# Patient Record
Sex: Female | Born: 1945 | Race: White | Hispanic: No | Smoking: Never smoker
Health system: Southern US, Community
[De-identification: ages and names within clinical notes are randomized; demographics above are authoritative.]

## PROBLEM LIST (undated history)

## (undated) DIAGNOSIS — M1990 Unspecified osteoarthritis, unspecified site: Secondary | ICD-10-CM

## (undated) DIAGNOSIS — I10 Essential (primary) hypertension: Secondary | ICD-10-CM

## (undated) HISTORY — PX: TUBAL LIGATION: SHX77

## (undated) HISTORY — PX: TONSILLECTOMY: SUR1361

---

## 2000-04-18 ENCOUNTER — Ambulatory Visit (HOSPITAL_COMMUNITY): Admission: RE | Admit: 2000-04-18 | Discharge: 2000-04-18 | Payer: Self-pay | Admitting: Gastroenterology

## 2000-04-18 ENCOUNTER — Encounter: Payer: Self-pay | Admitting: Gastroenterology

## 2000-04-21 ENCOUNTER — Ambulatory Visit (HOSPITAL_COMMUNITY): Admission: RE | Admit: 2000-04-21 | Discharge: 2000-04-21 | Payer: Self-pay | Admitting: Gastroenterology

## 2000-04-25 ENCOUNTER — Ambulatory Visit (HOSPITAL_COMMUNITY): Admission: RE | Admit: 2000-04-25 | Discharge: 2000-04-25 | Payer: Self-pay | Admitting: Gastroenterology

## 2000-04-25 ENCOUNTER — Encounter: Payer: Self-pay | Admitting: Gastroenterology

## 2000-04-28 ENCOUNTER — Ambulatory Visit (HOSPITAL_COMMUNITY): Admission: RE | Admit: 2000-04-28 | Discharge: 2000-04-28 | Payer: Self-pay | Admitting: Gastroenterology

## 2004-12-22 ENCOUNTER — Ambulatory Visit: Payer: Self-pay | Admitting: Urology

## 2006-11-13 IMAGING — CT CT ABDOMEN AND PELVIS WITHOUT AND WITH CONTRAST
2 of 7 series · 14 of 32 positions shown, 19 images · non-contrast
Comparison: none

REASON FOR EXAM: abdominal pain.blotting, change in bowel, renal colic,
bladder pressure
COMMENTS:

[Series 5: with inspace · axial · 0.68mm/px · z∈[-535,-157]mm · 8 of 488 slices shown, 13 images]
[im 55/488  soft-tissue]
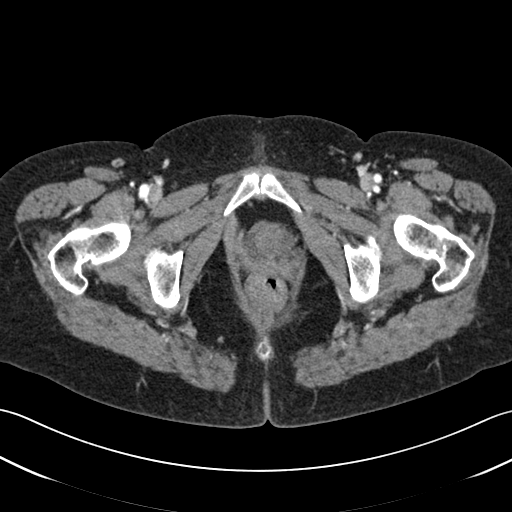
[im 55/488  bone]
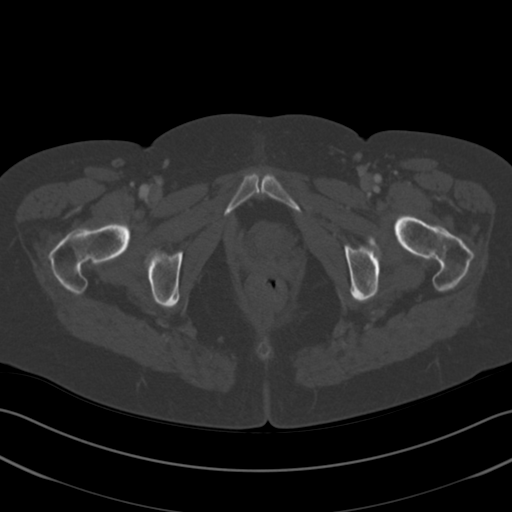
[im 109/488  soft-tissue]
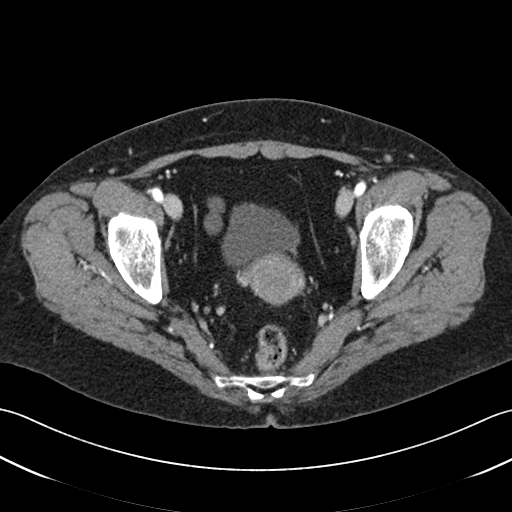
[im 163/488  soft-tissue]
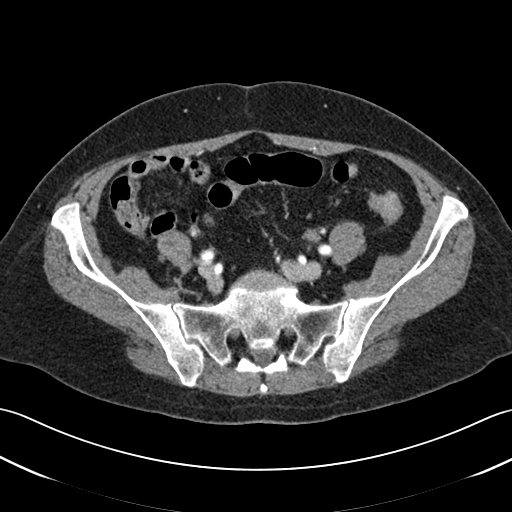
[im 217/488  soft-tissue]
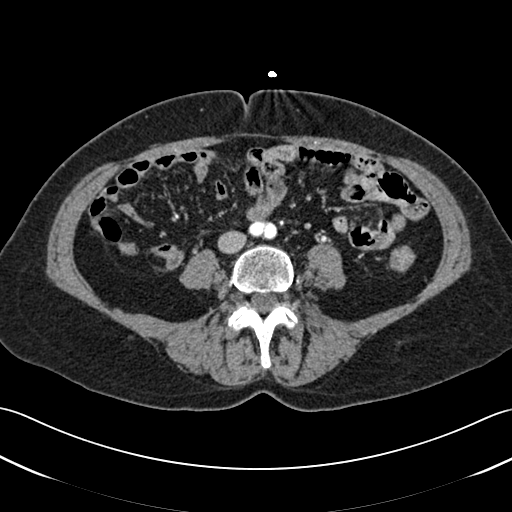
[im 271/488  soft-tissue]
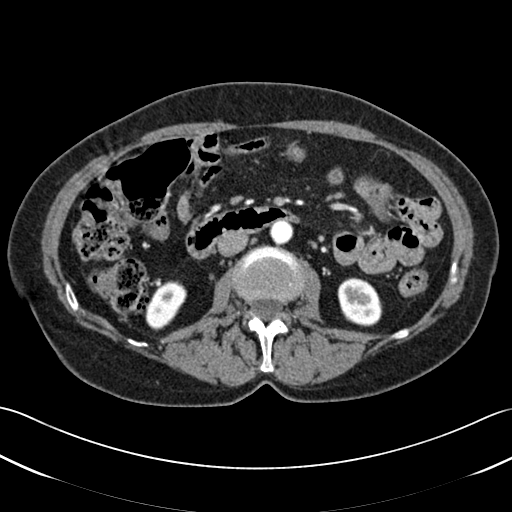
[im 271/488  lung]
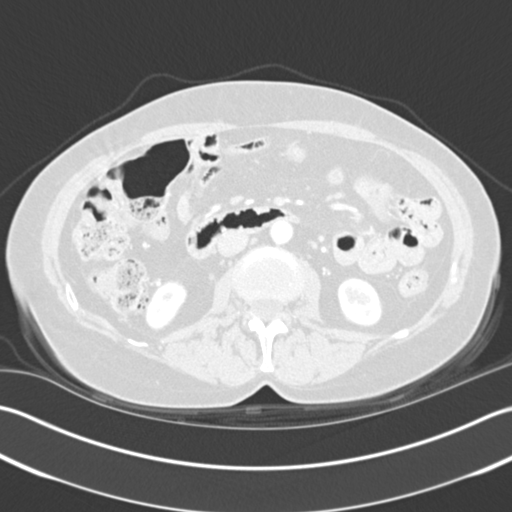
[im 325/488  soft-tissue]
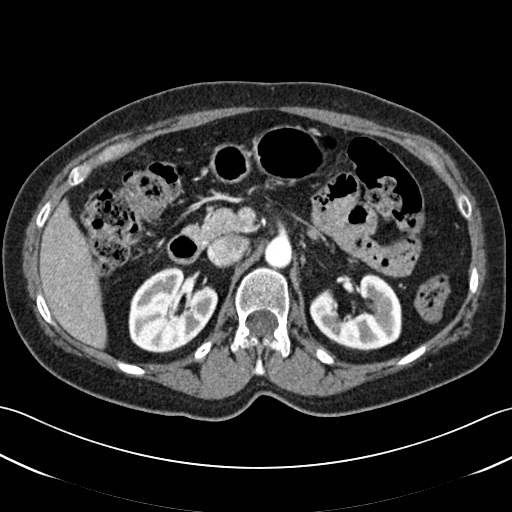
[im 325/488  lung]
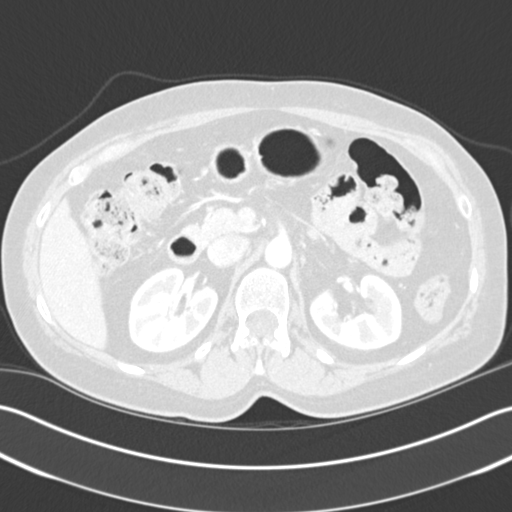
[im 379/488  soft-tissue]
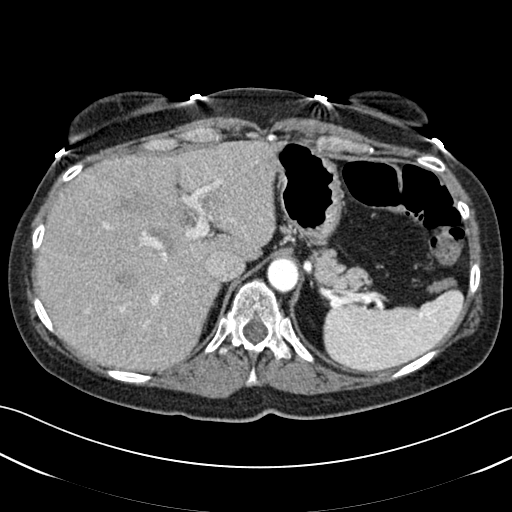
[im 379/488  lung]
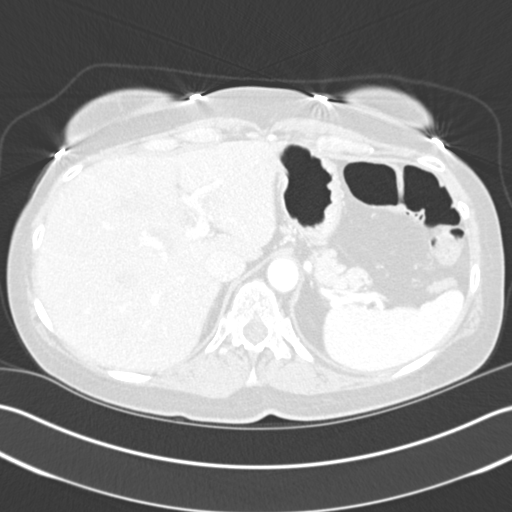
[im 433/488  soft-tissue]
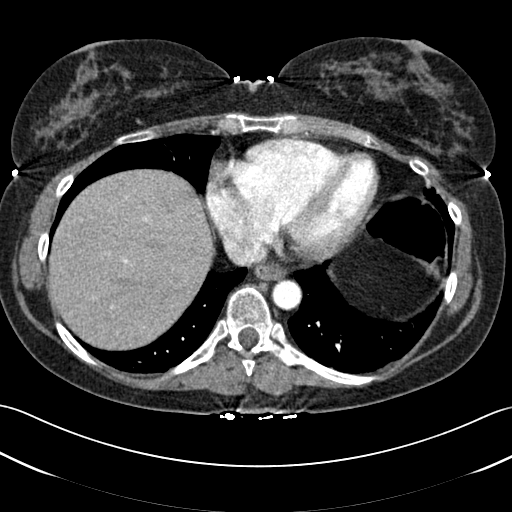
[im 433/488  lung]
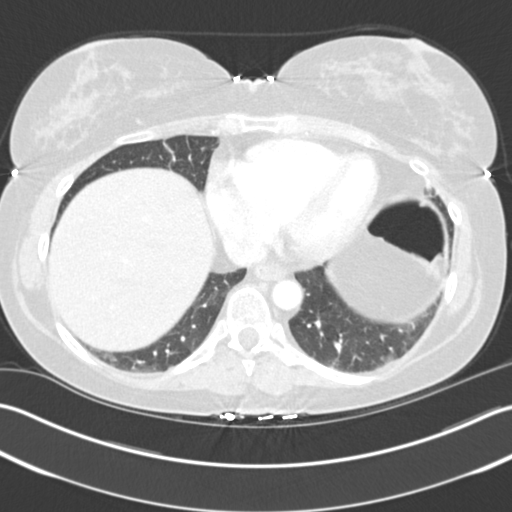

[Series 7: inspace · axial · 0.68mm/px · z∈[-535,-263]mm · 6 of 490 slices shown]
[im 55/490  soft-tissue]
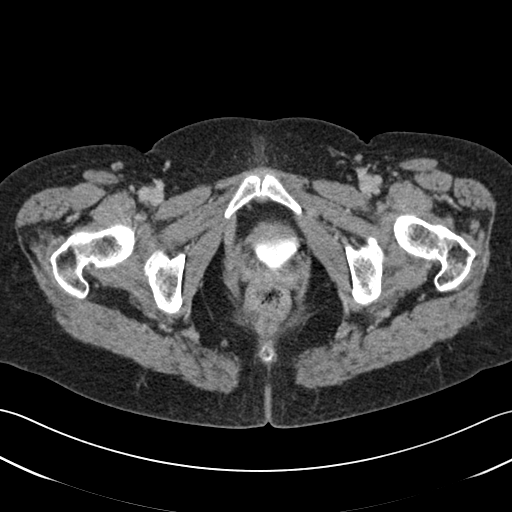
[im 109/490  soft-tissue]
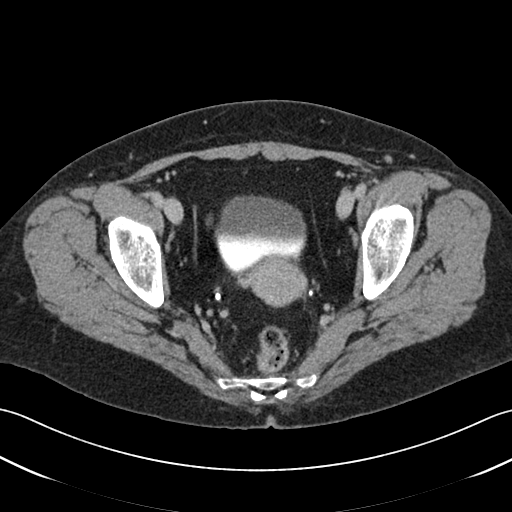
[im 164/490  soft-tissue]
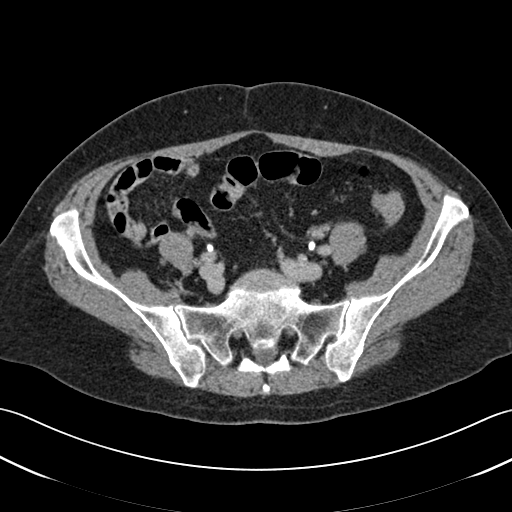
[im 218/490  soft-tissue]
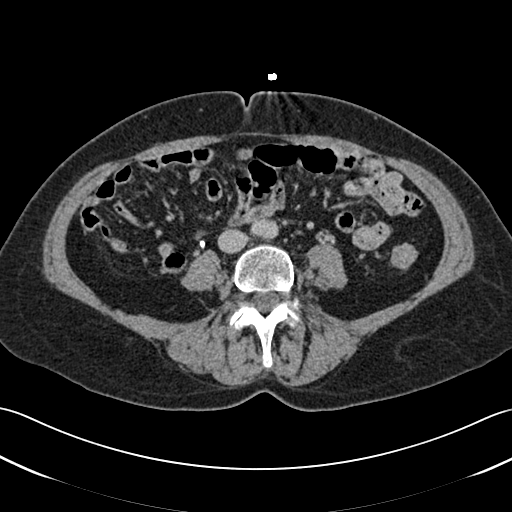
[im 272/490  soft-tissue]
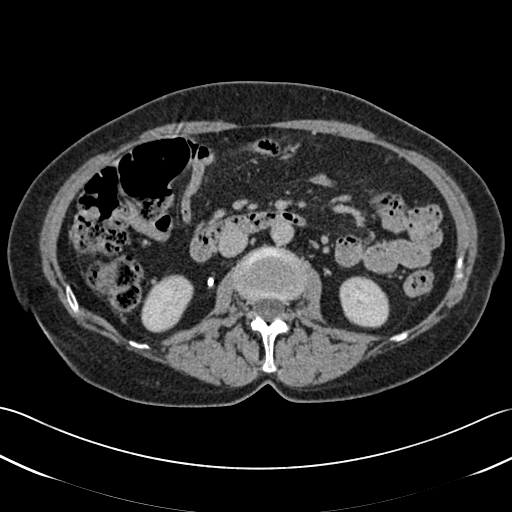
[im 327/490  soft-tissue]
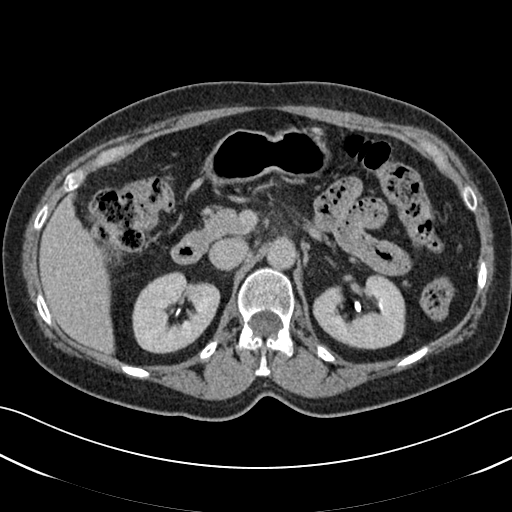

[14 of 32 positions shown; findings below may reference images not displayed]

PROCEDURE:     CT  - CT ABDOMEN / PELVIS  W/WO  - December 22, 2004  [DATE]

RESULT:         Standard triphasic CT of the abdomen and pelvis was
obtained.  The liver and gallbladder are normal.  There is no biliary
distention.  The pancreas is normal.  The adrenals are normal.  No focal
significant renal lesions are identified.  A tiny renal cyst including
parapelvic cysts are present.  There is no evidence of ureteral distention
or hydronephrosis.  Tiny calcified densities noted in the pelvis are most
consistent with phleboliths.  A very tiny sand-like stone in the LEFT distal
ureter cannot be completely excluded.  The abdominal aorta is normal.  No
evidence of retroperitoneal adenopathy.  The RIGHT lower quadrant is
unremarkable.  There is no bowel distention.  No pelvic masses are noted.
The bladder is unremarkable.  No inguinal adenopathy is noted.
IMPRESSION: Phleboliths in the pelvis.  A very tiny sand-like stone in the LEFT ureter
cannot be entirely excluded.

## 2012-04-26 ENCOUNTER — Ambulatory Visit: Payer: Self-pay

## 2012-05-10 ENCOUNTER — Ambulatory Visit: Payer: Self-pay | Admitting: Internal Medicine

## 2012-07-10 ENCOUNTER — Ambulatory Visit: Payer: Self-pay | Admitting: Gastroenterology

## 2012-07-11 LAB — PATHOLOGY REPORT

## 2019-08-15 ENCOUNTER — Other Ambulatory Visit: Payer: Self-pay | Admitting: Student

## 2019-08-15 DIAGNOSIS — K5900 Constipation, unspecified: Secondary | ICD-10-CM

## 2019-08-15 DIAGNOSIS — R1010 Upper abdominal pain, unspecified: Secondary | ICD-10-CM

## 2019-08-15 DIAGNOSIS — R140 Abdominal distension (gaseous): Secondary | ICD-10-CM

## 2019-08-24 ENCOUNTER — Other Ambulatory Visit: Payer: Self-pay

## 2019-08-24 ENCOUNTER — Ambulatory Visit
Admission: RE | Admit: 2019-08-24 | Discharge: 2019-08-24 | Disposition: A | Discharge status: Home / Self Care | Payer: Medicare Other | Source: Ambulatory Visit | Attending: Student | Admitting: Student

## 2019-08-24 DIAGNOSIS — R1010 Upper abdominal pain, unspecified: Secondary | ICD-10-CM | POA: Diagnosis present

## 2019-08-24 DIAGNOSIS — R140 Abdominal distension (gaseous): Secondary | ICD-10-CM | POA: Diagnosis present

## 2019-08-24 DIAGNOSIS — K5900 Constipation, unspecified: Secondary | ICD-10-CM | POA: Diagnosis present

## 2019-08-24 LAB — POCT I-STAT CREATININE: Creatinine, Ser: 0.8 mg/dL (ref 0.44–1.00)

## 2019-08-24 MED ORDER — IOHEXOL 300 MG/ML  SOLN
100.0000 mL | Freq: Once | INTRAMUSCULAR | Status: AC | PRN
  Administered 2019-08-24: 15:00:00 100 mL via INTRAVENOUS

## 2019-11-12 ENCOUNTER — Other Ambulatory Visit: Payer: Self-pay

## 2019-11-12 ENCOUNTER — Other Ambulatory Visit
Admission: RE | Admit: 2019-11-12 | Discharge: 2019-11-12 | Disposition: A | Discharge status: Home / Self Care | Payer: Medicare Other | Source: Ambulatory Visit | Attending: Internal Medicine | Admitting: Internal Medicine

## 2019-11-12 DIAGNOSIS — Z01812 Encounter for preprocedural laboratory examination: Secondary | ICD-10-CM | POA: Insufficient documentation

## 2019-11-12 DIAGNOSIS — Z20828 Contact with and (suspected) exposure to other viral communicable diseases: Secondary | ICD-10-CM | POA: Insufficient documentation

## 2019-11-13 LAB — SARS CORONAVIRUS 2 (TAT 6-24 HRS): SARS Coronavirus 2: NEGATIVE

## 2019-11-14 ENCOUNTER — Encounter: Payer: Self-pay | Admitting: *Deleted

## 2019-11-15 ENCOUNTER — Ambulatory Visit: Payer: Medicare Other | Admitting: Anesthesiology

## 2019-11-15 ENCOUNTER — Other Ambulatory Visit: Payer: Self-pay

## 2019-11-15 ENCOUNTER — Ambulatory Visit
Admission: RE | Admit: 2019-11-15 | Discharge: 2019-11-15 | Disposition: A | Discharge status: Home / Self Care | Payer: Medicare Other | Attending: Internal Medicine | Admitting: Internal Medicine

## 2019-11-15 ENCOUNTER — Encounter: Payer: Self-pay | Admitting: Internal Medicine

## 2019-11-15 ENCOUNTER — Encounter
Admission: RE | Disposition: A | Discharge status: Home / Self Care | Payer: Self-pay | Source: Home / Self Care | Attending: Internal Medicine

## 2019-11-15 DIAGNOSIS — Z79899 Other long term (current) drug therapy: Secondary | ICD-10-CM | POA: Diagnosis not present

## 2019-11-15 DIAGNOSIS — R1013 Epigastric pain: Secondary | ICD-10-CM | POA: Insufficient documentation

## 2019-11-15 DIAGNOSIS — K5730 Diverticulosis of large intestine without perforation or abscess without bleeding: Secondary | ICD-10-CM | POA: Diagnosis not present

## 2019-11-15 DIAGNOSIS — Z7951 Long term (current) use of inhaled steroids: Secondary | ICD-10-CM | POA: Insufficient documentation

## 2019-11-15 DIAGNOSIS — K641 Second degree hemorrhoids: Secondary | ICD-10-CM | POA: Insufficient documentation

## 2019-11-15 DIAGNOSIS — R933 Abnormal findings on diagnostic imaging of other parts of digestive tract: Secondary | ICD-10-CM | POA: Insufficient documentation

## 2019-11-15 DIAGNOSIS — K5909 Other constipation: Secondary | ICD-10-CM | POA: Diagnosis not present

## 2019-11-15 HISTORY — PX: COLONOSCOPY WITH PROPOFOL: SHX5780

## 2019-11-15 HISTORY — PX: ESOPHAGOGASTRODUODENOSCOPY (EGD) WITH PROPOFOL: SHX5813

## 2019-11-15 SURGERY — ESOPHAGOGASTRODUODENOSCOPY (EGD) WITH PROPOFOL
Anesthesia: General

## 2019-11-15 MED ORDER — SODIUM CHLORIDE 0.9 % IV SOLN
INTRAVENOUS | Status: DC
  Administered 2019-11-15: 1000 mL via INTRAVENOUS

## 2019-11-15 MED ORDER — PROPOFOL 500 MG/50ML IV EMUL
INTRAVENOUS | Status: DC | PRN
  Administered 2019-11-15: 175 ug/kg/min via INTRAVENOUS

## 2019-11-15 MED ORDER — LIDOCAINE HCL (CARDIAC) PF 100 MG/5ML IV SOSY: PREFILLED_SYRINGE | INTRAVENOUS | Status: DC | PRN

## 2019-11-15 MED ORDER — PROPOFOL 10 MG/ML IV BOLUS
INTRAVENOUS | Status: AC
  Filled 2019-11-15: qty 20

## 2019-11-15 MED ORDER — SODIUM CHLORIDE 0.9 % IV SOLN: INTRAVENOUS | Status: DC | PRN

## 2019-11-15 MED ORDER — LIDOCAINE HCL (CARDIAC) PF 100 MG/5ML IV SOSY
PREFILLED_SYRINGE | INTRAVENOUS | Status: DC | PRN
  Administered 2019-11-15: 40 mg via INTRAVENOUS

## 2019-11-15 MED ORDER — PROPOFOL 10 MG/ML IV BOLUS
INTRAVENOUS | Status: DC | PRN
  Administered 2019-11-15: 50 mg via INTRAVENOUS
  Administered 2019-11-15: 20 mg via INTRAVENOUS

## 2019-11-15 NOTE — Op Note (Signed)
[facility] Gastroenterology Patient Name: Carla SpineBetty Candella Procedure Date: 11/15/2019 9:36 AM MRN: [id number] Account #: [id number] Date of Birth: 1946-07-12 Admit Type: Outpatient Age: 73 [facility] 1 Gender: Female Note Status: Finalized Procedure:             Colonoscopy Indications:           Epigastric abdominal pain, Evaluation of abnormal                         imaging study (likely to be clinically significant),                         Change in bowel habits Providers:             Boykin Nearingeodoro K. Norma Fredricksonoledo MD, MD Referring MD:          No Local Md, MD (Referring MD) Medicines:             Propofol per Anesthesia Complications:         No immediate complications. Estimated blood loss: None. Procedure:             Pre-Anesthesia Assessment:                        - The risks and benefits of the procedure and the                         sedation options and risks were discussed with the                         patient. All questions were answered and informed                         consent was obtained.                        - Patient identification and proposed procedure were                         verified prior to the procedure by the nurse. The                         procedure was verified in the procedure room.                        - ASA Grade Assessment: III - A patient with severe                         systemic disease.                        - After reviewing the risks and benefits, the patient                         was deemed in satisfactory condition to undergo the                         procedure.                        After obtaining  informed consent, the colonoscope was                         passed under direct vision. Throughout the procedure,                         the patient's blood pressure, pulse, and oxygen                         saturations were monitored continuously. The                         Colonoscope was  introduced through the anus and                         advanced to the the cecum, identified by appendiceal                         orifice and ileocecal valve. The colonoscopy was                         performed without difficulty. The patient tolerated                         the procedure well. The quality of the bowel                         preparation was excellent. The ileocecal valve,                         appendiceal orifice, and rectum were photographed. Findings:      The perianal exam findings include internal hemorrhoids that prolapse       with straining, but spontaneously regress to the resting position (Grade       II).      Non-bleeding internal hemorrhoids were found during retroflexion. The       hemorrhoids were Grade I (internal hemorrhoids that do not prolapse).      A few small-mouthed diverticula were found in the sigmoid colon.      The exam was otherwise without abnormality on direct and retroflexion       views. Impression:            - Internal hemorrhoids that prolapse with straining,                         but spontaneously regress to the resting position                         (Grade II) found on perianal exam.                        - Non-bleeding internal hemorrhoids.                        - Diverticulosis in the sigmoid colon.                        - The examination was otherwise normal on direct and  retroflexion views.                        - No specimens collected. Recommendation:        - Patient has a contact number available for                         emergencies. The signs and symptoms of potential                         delayed complications were discussed with the patient.                         Return to normal activities tomorrow. Written                         discharge instructions were provided to the patient.                        - Resume previous diet.                        - Continue present  medications.                        - NO repeat colonoscopy due to age/comorbid status                        - Return to physician assistant in 3 months.                        - The findings and recommendations were discussed with                         the patient. Procedure Code(s):     --- Professional ---                        [phone], Colonoscopy, flexible; diagnostic, including                         collection of specimen(s) by brushing or washing, when                         performed (separate procedure) Diagnosis Code(s):     --- Professional ---                        R93.3, Abnormal findings on diagnostic imaging of                         other parts of digestive tract                        K57.30, Diverticulosis of large intestine without                         perforation or abscess without bleeding                        R19.4, Change in bowel habit  R10.13, Epigastric pain                        K64.1, Second degree hemorrhoids CPT copyright 2019 American Medical Association. All rights reserved. The codes documented in this report are preliminary and upon coder review may  be revised to meet current compliance requirements. Efrain Sella MD, MD 11/15/2019 10:09:02 AM This report has been signed electronically. Number of Addenda: 0 Note Initiated On: 11/15/2019 9:36 AM Scope Withdrawal Time: 0 hours 6 minutes 10 seconds  Total Procedure Duration: 0 hours 10 minutes 11 seconds  Estimated Blood Loss:  Estimated blood loss: none.      [facility]

## 2019-11-15 NOTE — Transfer of Care (Signed)
Immediate Anesthesia Transfer of Care Note  Patient: Carla David  Procedure(s) Performed: ESOPHAGOGASTRODUODENOSCOPY (EGD) WITH PROPOFOL (N/A ) COLONOSCOPY WITH PROPOFOL (N/A )  Patient Location: PACU and Endoscopy Unit  Anesthesia Type:General  Level of Consciousness: drowsy and responds to stimulation  Airway & Oxygen Therapy: Patient Spontanous Breathing and Patient connected to nasal cannula oxygen  Post-op Assessment: Report given to RN and Post -op Vital signs reviewed and stable  Post vital signs: Reviewed and stable  Last Vitals:  Vitals Value Taken Time  BP    Temp    Pulse 88 11/15/19 1008  Resp 19 11/15/19 1008  SpO2 97 % 11/15/19 1008  Vitals shown include unvalidated device data.  Last Pain:  Vitals:   11/15/19 0902  TempSrc: Temporal  PainSc: 0-No pain         Complications: No apparent anesthesia complications

## 2019-11-15 NOTE — Anesthesia Postprocedure Evaluation (Signed)
Anesthesia Post Note  Patient: Carla David  Procedure(s) Performed: ESOPHAGOGASTRODUODENOSCOPY (EGD) WITH PROPOFOL (N/A ) COLONOSCOPY WITH PROPOFOL (N/A )  Patient location during evaluation: Endoscopy Anesthesia Type: General Level of consciousness: awake and alert Pain management: pain level controlled Vital Signs Assessment: post-procedure vital signs reviewed and stable Respiratory status: spontaneous breathing, nonlabored ventilation, respiratory function stable and patient connected to nasal cannula oxygen Cardiovascular status: blood pressure returned to baseline and stable Postop Assessment: no apparent nausea or vomiting Anesthetic complications: no     Last Vitals:  Vitals:   11/15/19 1020 11/15/19 1030  BP: 107/62 126/75  Pulse: 85 80  Resp: 20 18  Temp:    SpO2: 100% 100%    Last Pain:  Vitals:   11/15/19 1030  TempSrc:   PainSc: 0-No pain                 Ayse Mccartin S

## 2019-11-15 NOTE — Anesthesia Post-op Follow-up Note (Signed)
Anesthesia QCDR form completed.        

## 2019-11-15 NOTE — Op Note (Signed)
[facility] Gastroenterology Patient Name: Carla David Procedure Date: 11/15/2019 9:36 AM MRN: [id number] Account #: [id number] Date of Birth: 13-Oct-1946 Admit Type: Outpatient Age: 73 Room: [facility] ENDO ROOM 1 Gender: Female Note Status: Finalized Procedure:             Upper GI endoscopy Indications:           Epigastric abdominal pain Providers:             Benay Pike. [name] MD, MD Referring MD:          No Local Md, MD (Referring MD) Medicines:             Propofol per Anesthesia Complications:         No immediate complications. Procedure:             Pre-Anesthesia Assessment:                        - The risks and benefits of the procedure and the                         sedation options and risks were discussed with the                         patient. All questions were answered and informed                         consent was obtained.                        - Patient identification and proposed procedure were                         verified prior to the procedure by the nurse. The                         procedure was verified in the procedure room.                        - ASA Grade Assessment: III - A patient with severe                         systemic disease.                        - After reviewing the risks and benefits, the patient                         was deemed in satisfactory condition to undergo the                         procedure.                        After obtaining informed consent, the endoscope was                         passed under direct vision. Throughout the procedure,                         the patient's blood pressure,  pulse, and oxygen                         saturations were monitored continuously. The Endoscope                         was introduced through the mouth, and advanced to the                         third part of duodenum. The upper GI endoscopy was                         accomplished without difficulty.  The patient tolerated                         the procedure well. Findings:      The esophagus was normal.      The stomach was normal.      The examined duodenum was normal. Impression:            - Normal esophagus.                        - Normal stomach.                        - Normal examined duodenum.                        - No specimens collected. Recommendation:        - Proceed with colonoscopy Procedure Code(s):     --- Professional ---                        [phone], Esophagogastroduodenoscopy, flexible,                         transoral; diagnostic, including collection of                         specimen(s) by brushing or washing, when performed                         (separate procedure) Diagnosis Code(s):     --- Professional ---                        R10.13, Epigastric pain CPT copyright 2019 American Medical Association. All rights reserved. The codes documented in this report are preliminary and upon coder review may  be revised to meet current compliance requirements. Stanton Kidney MD, MD 11/15/2019 9:52:55 AM This report has been signed electronically. Number of Addenda: 0 Note Initiated On: 11/15/2019 9:36 AM Estimated Blood Loss:  Estimated blood loss: none.      [facility]

## 2019-11-15 NOTE — H&P (Signed)
Outpatient short stay form Pre-procedure 11/15/2019 9:30 AM Carla David K. [name], M.D.  Primary Physician: Alwyn Pea, NP  Reason for visit: Epigastric pain, bloating, change in bowel habits. Abnormal CT of the colon.  History of present illness:  As above. Otherwise, Patient denies  rectal bleeding, weight loss or abdominal pain. Has moderate chronic constipation. CT showed mild eccentric wall thickening around the posterior cecum.     Current Facility-Administered Medications:  .  0.9 %  sodium chloride infusion, , Intravenous, Continuous, Sherald Balbuena, Benay Pike, MD  Medications Prior to Admission  Medication Sig Dispense Refill Last Dose  . albuterol (ACCUNEB) 0.63 MG/3ML nebulizer solution Take 1 ampule by nebulization every 6 (six) hours as needed for wheezing.    at prn  . fluticasone (FLONASE) 50 MCG/ACT nasal spray Place into both nostrils daily.    at prn  . ibuprofen (ADVIL) 200 MG tablet Take 200 mg by mouth every 6 (six) hours as needed.   Past Month at Unknown time  . cefdinir (OMNICEF) 300 MG capsule Take 300 mg by mouth 2 (two) times daily.   Completed Course at Unknown time  . Chlorphen-Pseudoephed-APAP (CORICIDIN D PO) Take by mouth.   Completed Course at Unknown time  . ondansetron (ZOFRAN) 4 MG tablet Take 4 mg by mouth every 8 (eight) hours as needed for nausea or vomiting.   Not Taking at Unknown time  . phenazopyridine (PYRIDIUM) 100 MG tablet Take 100 mg by mouth 3 (three) times daily as needed for pain.   Not Taking at Unknown time     No Known Allergies   History reviewed. No pertinent past medical history.  Review of systems:  Otherwise negative.    Physical Exam  Gen: Alert, oriented. Appears stated age.  HEENT: [place]/AT. PERRLA. Lungs: CTA, no wheezes. CV: RR nl S1, S2. Abd: soft, benign, no masses. BS+ Ext: No edema. Pulses 2+    Planned procedures: Proceed with EGD and colonoscopy. The patient understands the nature of the planned procedure,  indications, risks, alternatives and potential complications including but not limited to bleeding, infection, perforation, damage to internal organs and possible oversedation/side effects from anesthesia. The patient agrees and gives consent to proceed.  Please refer to procedure notes for findings, recommendations and patient disposition/instructions.     Jolean Madariaga K. [name], M.D. Gastroenterology 11/15/2019  9:30 AM

## 2019-11-15 NOTE — Interval H&P Note (Signed)
History and Physical Interval Note:  11/15/2019 9:35 AM  Carla David  has presented today for surgery, with the diagnosis of constipation bloat upper abd.  The various methods of treatment have been discussed with the patient and family. After consideration of risks, benefits and other options for treatment, the patient has consented to  Procedure(s): ESOPHAGOGASTRODUODENOSCOPY (EGD) WITH PROPOFOL (N/A) COLONOSCOPY WITH PROPOFOL (N/A) as a surgical intervention.  The patient's history has been reviewed, patient examined, no change in status, stable for surgery.  I have reviewed the patient's chart and labs.  Questions were answered to the patient's satisfaction.     [place], [place]

## 2019-11-15 NOTE — Anesthesia Preprocedure Evaluation (Signed)
Anesthesia Evaluation  Patient identified by MRN, date of birth, ID band Patient awake    Reviewed: Allergy & Precautions, NPO status , Patient's Chart, lab work & pertinent test results, reviewed documented beta blocker date and time   Airway Mallampati: II  TM Distance: >3 FB     Dental  (+) Chipped   Pulmonary           Cardiovascular      Neuro/Psych    GI/Hepatic   Endo/Other    Renal/GU      Musculoskeletal   Abdominal   Peds  Hematology   Anesthesia Other Findings   Reproductive/Obstetrics                             Anesthesia Physical Anesthesia Plan  ASA: II  Anesthesia Plan: General   Post-op Pain Management:    Induction: Intravenous  PONV Risk Score and Plan:   Airway Management Planned:   Additional Equipment:   Intra-op Plan:   Post-operative Plan:   Informed Consent: I have reviewed the patients History and Physical, chart, labs and discussed the procedure including the risks, benefits and alternatives for the proposed anesthesia with the patient or authorized representative who has indicated his/her understanding and acceptance.     Plan Discussed with: CRNA  Anesthesia Plan Comments:         Anesthesia Quick Evaluation  

## 2019-11-16 ENCOUNTER — Encounter: Payer: Self-pay | Admitting: *Deleted

## 2020-01-10 ENCOUNTER — Other Ambulatory Visit: Payer: Self-pay | Admitting: Physician Assistant

## 2020-01-10 DIAGNOSIS — Z13820 Encounter for screening for osteoporosis: Secondary | ICD-10-CM

## 2020-07-03 ENCOUNTER — Other Ambulatory Visit: Payer: Self-pay

## 2020-07-03 ENCOUNTER — Encounter: Payer: Self-pay | Admitting: Ophthalmology

## 2020-07-07 ENCOUNTER — Other Ambulatory Visit
Admission: RE | Admit: 2020-07-07 | Discharge: 2020-07-07 | Disposition: A | Discharge status: Home / Self Care | Payer: Medicare Other | Source: Ambulatory Visit | Attending: Ophthalmology | Admitting: Ophthalmology

## 2020-07-07 ENCOUNTER — Other Ambulatory Visit: Payer: Self-pay

## 2020-07-07 DIAGNOSIS — Z01812 Encounter for preprocedural laboratory examination: Secondary | ICD-10-CM | POA: Diagnosis present

## 2020-07-07 DIAGNOSIS — Z20822 Contact with and (suspected) exposure to covid-19: Secondary | ICD-10-CM | POA: Diagnosis not present

## 2020-07-07 LAB — SARS CORONAVIRUS 2 (TAT 6-24 HRS): SARS Coronavirus 2: NEGATIVE

## 2020-07-08 NOTE — Discharge Instructions (Signed)

## 2020-07-09 ENCOUNTER — Ambulatory Visit: Payer: Medicare Other | Admitting: Anesthesiology

## 2020-07-09 ENCOUNTER — Ambulatory Visit
Admission: RE | Admit: 2020-07-09 | Discharge: 2020-07-09 | Disposition: A | Discharge status: Home / Self Care | Payer: Medicare Other | Attending: Ophthalmology | Admitting: Ophthalmology

## 2020-07-09 ENCOUNTER — Encounter: Payer: Self-pay | Admitting: Ophthalmology

## 2020-07-09 ENCOUNTER — Encounter
Admission: RE | Disposition: A | Discharge status: Home / Self Care | Payer: Self-pay | Source: Home / Self Care | Attending: Ophthalmology

## 2020-07-09 ENCOUNTER — Other Ambulatory Visit: Payer: Self-pay

## 2020-07-09 DIAGNOSIS — I10 Essential (primary) hypertension: Secondary | ICD-10-CM | POA: Insufficient documentation

## 2020-07-09 DIAGNOSIS — M1990 Unspecified osteoarthritis, unspecified site: Secondary | ICD-10-CM | POA: Insufficient documentation

## 2020-07-09 DIAGNOSIS — Z79890 Hormone replacement therapy (postmenopausal): Secondary | ICD-10-CM | POA: Insufficient documentation

## 2020-07-09 DIAGNOSIS — H2511 Age-related nuclear cataract, right eye: Secondary | ICD-10-CM | POA: Insufficient documentation

## 2020-07-09 DIAGNOSIS — Z79899 Other long term (current) drug therapy: Secondary | ICD-10-CM | POA: Insufficient documentation

## 2020-07-09 HISTORY — DX: Unspecified osteoarthritis, unspecified site: M19.90

## 2020-07-09 HISTORY — DX: Essential (primary) hypertension: I10

## 2020-07-09 HISTORY — PX: CATARACT EXTRACTION W/PHACO: SHX586

## 2020-07-09 SURGERY — PHACOEMULSIFICATION, CATARACT, WITH IOL INSERTION
Anesthesia: Monitor Anesthesia Care | Site: Eye | Laterality: Right

## 2020-07-09 MED ORDER — FENTANYL CITRATE (PF) 100 MCG/2ML IJ SOLN
INTRAMUSCULAR | Status: DC | PRN
Start: 2020-07-09 — End: 2020-07-09
  Administered 2020-07-09: 25 ug via INTRAVENOUS
  Administered 2020-07-09: 50 ug via INTRAVENOUS

## 2020-07-09 MED ORDER — ACETAMINOPHEN 10 MG/ML IV SOLN: 1000.0000 mg | Freq: Once | INTRAVENOUS | Status: DC | PRN

## 2020-07-09 MED ORDER — LIDOCAINE HCL (PF) 2 % IJ SOLN
INTRAOCULAR | Status: DC | PRN
  Administered 2020-07-09: 2 mL

## 2020-07-09 MED ORDER — EPINEPHRINE PF 1 MG/ML IJ SOLN
INTRAOCULAR | Status: DC | PRN
  Administered 2020-07-09: 83 mL via OPHTHALMIC

## 2020-07-09 MED ORDER — NA HYALUR & NA CHOND-NA HYALUR 0.4-0.35 ML IO KIT
PACK | INTRAOCULAR | Status: DC | PRN
  Administered 2020-07-09: 1 mL via INTRAOCULAR

## 2020-07-09 MED ORDER — MIDAZOLAM HCL 2 MG/2ML IJ SOLN
INTRAMUSCULAR | Status: DC | PRN
  Administered 2020-07-09: .5 mg via INTRAVENOUS
  Administered 2020-07-09: 1 mg via INTRAVENOUS

## 2020-07-09 MED ORDER — LACTATED RINGERS IV SOLN: INTRAVENOUS | Status: DC

## 2020-07-09 MED ORDER — MOXIFLOXACIN HCL 0.5 % OP SOLN
1.0000 [drp] | OPHTHALMIC | Status: DC | PRN
  Administered 2020-07-09 (×3): 1 [drp] via OPHTHALMIC

## 2020-07-09 MED ORDER — ONDANSETRON HCL 4 MG/2ML IJ SOLN: 4.0000 mg | Freq: Once | INTRAMUSCULAR | Status: DC | PRN

## 2020-07-09 MED ORDER — BRIMONIDINE TARTRATE-TIMOLOL 0.2-0.5 % OP SOLN
OPHTHALMIC | Status: DC | PRN
  Administered 2020-07-09: 1 [drp] via OPHTHALMIC

## 2020-07-09 MED ORDER — TETRACAINE HCL 0.5 % OP SOLN
1.0000 [drp] | OPHTHALMIC | Status: DC | PRN
  Administered 2020-07-09 (×3): 1 [drp] via OPHTHALMIC

## 2020-07-09 MED ORDER — CEFUROXIME OPHTHALMIC INJECTION 1 MG/0.1 ML
INJECTION | OPHTHALMIC | Status: DC | PRN
  Administered 2020-07-09: 0.1 mL via INTRACAMERAL

## 2020-07-09 MED ORDER — ARMC OPHTHALMIC DILATING DROPS
1.0000 "application " | OPHTHALMIC | Status: DC | PRN
  Administered 2020-07-09 (×3): 1 via OPHTHALMIC

## 2020-07-09 SURGICAL SUPPLY — 23 items
CANNULA ANT/CHMB 27G (MISCELLANEOUS) ×1 IMPLANT
CANNULA ANT/CHMB 27GA (MISCELLANEOUS) ×3 IMPLANT
GLOVE SURG LX 7.5 STRW (GLOVE) ×2
GLOVE SURG LX STRL 7.5 STRW (GLOVE) ×1 IMPLANT
GLOVE SURG TRIUMPH 8.0 PF LTX (GLOVE) ×3 IMPLANT
GOWN STRL REUS W/ TWL LRG LVL3 (GOWN DISPOSABLE) ×2 IMPLANT
GOWN STRL REUS W/TWL LRG LVL3 (GOWN DISPOSABLE) ×6
LENS IOL DIOP 21.5 (Intraocular Lens) ×3 IMPLANT
LENS IOL TECNIS MONO 21.5 (Intraocular Lens) IMPLANT
MARKER SKIN DUAL TIP RULER LAB (MISCELLANEOUS) ×3 IMPLANT
NDL CAPSULORHEX 25GA (NEEDLE) ×1 IMPLANT
NDL FILTER BLUNT 18X1 1/2 (NEEDLE) ×2 IMPLANT
NEEDLE CAPSULORHEX 25GA (NEEDLE) ×3 IMPLANT
NEEDLE FILTER BLUNT 18X 1/2SAF (NEEDLE) ×4
NEEDLE FILTER BLUNT 18X1 1/2 (NEEDLE) ×2 IMPLANT
PACK CATARACT BRASINGTON (MISCELLANEOUS) ×3 IMPLANT
PACK EYE AFTER SURG (MISCELLANEOUS) ×3 IMPLANT
PACK OPTHALMIC (MISCELLANEOUS) ×3 IMPLANT
SOLUTION OPHTHALMIC SALT (MISCELLANEOUS) ×3 IMPLANT
SYR 3ML LL SCALE MARK (SYRINGE) ×6 IMPLANT
SYR TB 1ML LUER SLIP (SYRINGE) ×3 IMPLANT
WATER STERILE IRR 250ML POUR (IV SOLUTION) ×3 IMPLANT
WIPE NON LINTING 3.25X3.25 (MISCELLANEOUS) ×3 IMPLANT

## 2020-07-09 NOTE — H&P (Signed)

## 2020-07-09 NOTE — Op Note (Signed)
LOCATION:  [facility]   PREOPERATIVE DIAGNOSIS:    Nuclear sclerotic cataract right eye. H25.11   POSTOPERATIVE DIAGNOSIS:  Nuclear sclerotic cataract right eye.     PROCEDURE:  Phacoemusification with posterior chamber intraocular lens placement of the right eye   ULTRASOUND TIME: Procedure(s): CATARACT EXTRACTION PHACO AND INTRAOCULAR LENS PLACEMENT (IOC) RIGHT 7.34 01:03.9 11.5% (Right)  LENS:   Implant Name Type Inv. Item Serial No. Manufacturer Lot No. LRB No. Used Action  LENS IOL DIOP 21.5 - [id number] Intraocular Lens LENS IOL DIOP 21.5 [id number] [employer]  Right 1 Implanted         SURGEON:  Deirdre Evener, MD   ANESTHESIA:  Topical with tetracaine drops and 2% Xylocaine jelly, augmented with 1% preservative-free intracameral lidocaine.    COMPLICATIONS:  None.   DESCRIPTION OF PROCEDURE:  The patient was identified in the holding room and transported to the operating room and placed in the supine position under the operating microscope.  The right eye was identified as the operative eye and it was prepped and draped in the usual sterile ophthalmic fashion.   A 1 millimeter clear-corneal paracentesis was made at the 12:00 position.  0.5 ml of preservative-free 1% lidocaine was injected into the anterior chamber. The anterior chamber was filled with Viscoat viscoelastic.  A 2.4 millimeter keratome was used to make a near-clear corneal incision at the 9:00 position.  A curvilinear capsulorrhexis was made with a cystotome and capsulorrhexis forceps.  Balanced salt solution was used to hydrodissect and hydrodelineate the nucleus.   Phacoemulsification was then used in stop and chop fashion to remove the lens nucleus and epinucleus.  The remaining cortex was then removed using the irrigation and aspiration handpiece. Provisc was then placed into the capsular bag to distend it for lens placement.  A lens was then injected into the capsular bag.   The remaining viscoelastic was aspirated.   Wounds were hydrated with balanced salt solution.  The anterior chamber was inflated to a physiologic pressure with balanced salt solution.  No wound leaks were noted. Cefuroxime 0.1 ml of a 10mg /ml solution was injected into the anterior chamber for a dose of 1 mg of intracameral antibiotic at the completion of the case.   Timolol and Brimonidine drops were applied to the eye.  The patient was taken to the recovery room in stable condition without complications of anesthesia or surgery.   Randel Hargens 07/09/2020, 11:30 AM

## 2020-07-09 NOTE — Transfer of Care (Signed)
Immediate Anesthesia Transfer of Care Note  Patient: Carla David  Procedure(s) Performed: CATARACT EXTRACTION PHACO AND INTRAOCULAR LENS PLACEMENT (IOC) RIGHT 7.34 01:03.9 11.5% (Right Eye)  Patient Location: PACU  Anesthesia Type: MAC  Level of Consciousness: awake, alert  and patient cooperative  Airway and Oxygen Therapy: Patient Spontanous Breathing  Post-op Assessment: Post-op Vital signs reviewed, Patient's Cardiovascular Status Stable, Respiratory Function Stable, Patent Airway and No signs of Nausea or vomiting  Post-op Vital Signs: Reviewed and stable  Complications: No complications documented.

## 2020-07-09 NOTE — Anesthesia Preprocedure Evaluation (Signed)
Anesthesia Evaluation  Patient identified by MRN, date of birth, ID band Patient awake    Reviewed: Allergy & Precautions, NPO status , Patient's Chart, lab work & pertinent test results, reviewed documented beta blocker date and time   History of Anesthesia Complications Negative for: history of anesthetic complications  Airway Mallampati: II  TM Distance: >3 FB Neck ROM: Full    Dental   Pulmonary    breath sounds clear to auscultation       Cardiovascular hypertension, (-) angina(-) DOE  Rhythm:Regular Rate:Normal   Stress echo (02/2020): NORMAL STRESS TEST  VALVULAR REGURGITATION: TRIVIAL AR, TRIVIAL MR, TRIVIAL PR, TRIVIAL TR  NO VALVULAR STENOSIS  Maximum workload of 10.10 METs was achieved during exercise.     Neuro/Psych    GI/Hepatic neg GERD  ,  Endo/Other    Renal/GU      Musculoskeletal  (+) Arthritis ,   Abdominal   Peds  Hematology   Anesthesia Other Findings   Reproductive/Obstetrics                            Anesthesia Physical Anesthesia Plan  ASA: II  Anesthesia Plan: MAC   Post-op Pain Management:    Induction: Intravenous  PONV Risk Score and Plan: 2 and TIVA, Midazolam and Treatment may vary due to age or medical condition  Airway Management Planned: Nasal Cannula  Additional Equipment:   Intra-op Plan:   Post-operative Plan:   Informed Consent: I have reviewed the patients History and Physical, chart, labs and discussed the procedure including the risks, benefits and alternatives for the proposed anesthesia with the patient or authorized representative who has indicated his/her understanding and acceptance.       Plan Discussed with: CRNA and Anesthesiologist  Anesthesia Plan Comments:         Anesthesia Quick Evaluation

## 2020-07-09 NOTE — Anesthesia Postprocedure Evaluation (Signed)
Anesthesia Post Note  Patient: Carla David  Procedure(s) Performed: CATARACT EXTRACTION PHACO AND INTRAOCULAR LENS PLACEMENT (IOC) RIGHT 7.34 01:03.9 11.5% (Right Eye)     Patient location during evaluation: PACU Anesthesia Type: MAC Level of consciousness: awake and alert Pain management: pain level controlled Vital Signs Assessment: post-procedure vital signs reviewed and stable Respiratory status: spontaneous breathing, nonlabored ventilation, respiratory function stable and patient connected to nasal cannula oxygen Cardiovascular status: stable and blood pressure returned to baseline Postop Assessment: no apparent nausea or vomiting Anesthetic complications: no   No complications documented.  Rhydian Baldi A  Rendi Mapel

## 2020-07-09 NOTE — Anesthesia Procedure Notes (Signed)
Procedure Name: MAC Date/Time: 07/09/2020 11:12 AM Performed by: Vanetta Shawl, CRNA Pre-anesthesia Checklist: Patient identified, Emergency Drugs available, Suction available, Timeout performed and Patient being monitored Patient Re-evaluated:Patient Re-evaluated prior to induction Oxygen Delivery Method: Nasal cannula Placement Confirmation: positive ETCO2

## 2020-07-10 ENCOUNTER — Encounter: Payer: Self-pay | Admitting: Ophthalmology

## 2020-07-17 ENCOUNTER — Encounter: Payer: Self-pay | Admitting: Ophthalmology

## 2020-07-28 ENCOUNTER — Other Ambulatory Visit
Admission: RE | Admit: 2020-07-28 | Discharge: 2020-07-28 | Disposition: A | Discharge status: Home / Self Care | Payer: Medicare Other | Source: Ambulatory Visit | Attending: Ophthalmology | Admitting: Ophthalmology

## 2020-07-28 DIAGNOSIS — Z20822 Contact with and (suspected) exposure to covid-19: Secondary | ICD-10-CM | POA: Diagnosis not present

## 2020-07-28 DIAGNOSIS — Z01812 Encounter for preprocedural laboratory examination: Secondary | ICD-10-CM | POA: Diagnosis present

## 2020-07-28 NOTE — Discharge Instructions (Signed)

## 2020-07-29 LAB — SARS CORONAVIRUS 2 (TAT 6-24 HRS): SARS Coronavirus 2: NEGATIVE

## 2020-07-30 ENCOUNTER — Other Ambulatory Visit: Payer: Self-pay

## 2020-07-30 ENCOUNTER — Encounter: Payer: Self-pay | Admitting: Ophthalmology

## 2020-07-30 ENCOUNTER — Ambulatory Visit: Payer: Medicare Other | Admitting: Anesthesiology

## 2020-07-30 ENCOUNTER — Encounter
Admission: RE | Disposition: A | Discharge status: Home / Self Care | Payer: Self-pay | Source: Home / Self Care | Attending: Ophthalmology

## 2020-07-30 ENCOUNTER — Ambulatory Visit
Admission: RE | Admit: 2020-07-30 | Discharge: 2020-07-30 | Disposition: A | Discharge status: Home / Self Care | Payer: Medicare Other | Attending: Ophthalmology | Admitting: Ophthalmology

## 2020-07-30 DIAGNOSIS — H2512 Age-related nuclear cataract, left eye: Secondary | ICD-10-CM | POA: Diagnosis not present

## 2020-07-30 DIAGNOSIS — I10 Essential (primary) hypertension: Secondary | ICD-10-CM | POA: Insufficient documentation

## 2020-07-30 DIAGNOSIS — Z79899 Other long term (current) drug therapy: Secondary | ICD-10-CM | POA: Diagnosis not present

## 2020-07-30 DIAGNOSIS — M1990 Unspecified osteoarthritis, unspecified site: Secondary | ICD-10-CM | POA: Insufficient documentation

## 2020-07-30 HISTORY — PX: CATARACT EXTRACTION W/PHACO: SHX586

## 2020-07-30 SURGERY — PHACOEMULSIFICATION, CATARACT, WITH IOL INSERTION
Anesthesia: Monitor Anesthesia Care | Site: Eye | Laterality: Left

## 2020-07-30 MED ORDER — LIDOCAINE HCL (PF) 2 % IJ SOLN
INTRAOCULAR | Status: DC | PRN
  Administered 2020-07-30: 2 mL

## 2020-07-30 MED ORDER — MIDAZOLAM HCL 2 MG/2ML IJ SOLN
INTRAMUSCULAR | Status: DC | PRN
  Administered 2020-07-30: 2 mg via INTRAVENOUS

## 2020-07-30 MED ORDER — NA HYALUR & NA CHOND-NA HYALUR 0.4-0.35 ML IO KIT
PACK | INTRAOCULAR | Status: DC | PRN
  Administered 2020-07-30: 1 mL via INTRAOCULAR

## 2020-07-30 MED ORDER — ACETAMINOPHEN 325 MG PO TABS: 325.0000 mg | ORAL_TABLET | ORAL | Status: DC | PRN

## 2020-07-30 MED ORDER — FENTANYL CITRATE (PF) 100 MCG/2ML IJ SOLN
INTRAMUSCULAR | Status: DC | PRN
  Administered 2020-07-30 (×2): 50 ug via INTRAVENOUS

## 2020-07-30 MED ORDER — TETRACAINE HCL 0.5 % OP SOLN
1.0000 [drp] | OPHTHALMIC | Status: DC | PRN
  Administered 2020-07-30 (×3): 1 [drp] via OPHTHALMIC

## 2020-07-30 MED ORDER — ACETAMINOPHEN 160 MG/5ML PO SOLN: 325.0000 mg | ORAL | Status: DC | PRN

## 2020-07-30 MED ORDER — BRIMONIDINE TARTRATE-TIMOLOL 0.2-0.5 % OP SOLN
OPHTHALMIC | Status: DC | PRN
  Administered 2020-07-30: 1 [drp] via OPHTHALMIC

## 2020-07-30 MED ORDER — CEFUROXIME OPHTHALMIC INJECTION 1 MG/0.1 ML
INJECTION | OPHTHALMIC | Status: DC | PRN
  Administered 2020-07-30: 0.1 mL via INTRACAMERAL

## 2020-07-30 MED ORDER — EPINEPHRINE PF 1 MG/ML IJ SOLN
INTRAOCULAR | Status: DC | PRN
  Administered 2020-07-30: 58 mL via OPHTHALMIC

## 2020-07-30 MED ORDER — ARMC OPHTHALMIC DILATING DROPS
1.0000 "application " | OPHTHALMIC | Status: DC | PRN
  Administered 2020-07-30 (×3): 1 via OPHTHALMIC

## 2020-07-30 MED ORDER — MOXIFLOXACIN HCL 0.5 % OP SOLN
1.0000 [drp] | OPHTHALMIC | Status: DC | PRN
  Administered 2020-07-30 (×3): 1 [drp] via OPHTHALMIC

## 2020-07-30 MED ORDER — ONDANSETRON HCL 4 MG/2ML IJ SOLN: 4.0000 mg | Freq: Once | INTRAMUSCULAR | Status: DC | PRN

## 2020-07-30 SURGICAL SUPPLY — 29 items
CANNULA ANT/CHMB 27G (MISCELLANEOUS) ×1 IMPLANT
CANNULA ANT/CHMB 27GA (MISCELLANEOUS) ×3 IMPLANT
GLOVE SURG LX 7.5 STRW (GLOVE) ×4
GLOVE SURG LX STRL 7.5 STRW (GLOVE) ×1 IMPLANT
GLOVE SURG TRIUMPH 8.0 PF LTX (GLOVE) ×3 IMPLANT
GOWN STRL REUS W/ TWL LRG LVL3 (GOWN DISPOSABLE) ×2 IMPLANT
GOWN STRL REUS W/TWL LRG LVL3 (GOWN DISPOSABLE) ×6
LENS IOL DIOP 22.0 (Intraocular Lens) ×3 IMPLANT
LENS IOL TECNIS MONO 22.0 (Intraocular Lens) IMPLANT
MARKER SKIN DUAL TIP RULER LAB (MISCELLANEOUS) ×3 IMPLANT
NDL CAPSULORHEX 25GA (NEEDLE) ×1 IMPLANT
NDL FILTER BLUNT 18X1 1/2 (NEEDLE) ×2 IMPLANT
NDL RETROBULBAR .5 NSTRL (NEEDLE) IMPLANT
NEEDLE CAPSULORHEX 25GA (NEEDLE) ×3 IMPLANT
NEEDLE FILTER BLUNT 18X 1/2SAF (NEEDLE) ×4
NEEDLE FILTER BLUNT 18X1 1/2 (NEEDLE) ×2 IMPLANT
PACK CATARACT BRASINGTON (MISCELLANEOUS) ×3 IMPLANT
PACK EYE AFTER SURG (MISCELLANEOUS) ×3 IMPLANT
PACK OPTHALMIC (MISCELLANEOUS) ×3 IMPLANT
RING MALYGIN 7.0 (MISCELLANEOUS) IMPLANT
SOLUTION OPHTHALMIC SALT (MISCELLANEOUS) ×3 IMPLANT
SUT ETHILON 10-0 CS-B-6CS-B-6 (SUTURE)
SUT VICRYL  9 0 (SUTURE)
SUT VICRYL 9 0 (SUTURE) IMPLANT
SUTURE EHLN 10-0 CS-B-6CS-B-6 (SUTURE) IMPLANT
SYR 3ML LL SCALE MARK (SYRINGE) ×6 IMPLANT
SYR TB 1ML LUER SLIP (SYRINGE) ×3 IMPLANT
WATER STERILE IRR 250ML POUR (IV SOLUTION) ×3 IMPLANT
WIPE NON LINTING 3.25X3.25 (MISCELLANEOUS) ×3 IMPLANT

## 2020-07-30 NOTE — Op Note (Signed)
OPERATIVE NOTE  Carla David [id number] 07/30/2020   PREOPERATIVE DIAGNOSIS:  Nuclear sclerotic cataract left eye. H25.12   POSTOPERATIVE DIAGNOSIS:    Nuclear sclerotic cataract left eye.     PROCEDURE:  Phacoemusification with posterior chamber intraocular lens placement of the left eye  Ultrasound time: Procedure(s): CATARACT EXTRACTION PHACO AND INTRAOCULAR LENS PLACEMENT (IOC) LEFT 9.18 01:14.5 12.3% (Left)  LENS:   Implant Name Type Inv. Item Serial No. Manufacturer Lot No. LRB No. Used Action  LENS IOL DIOP 22.0 - [id number] Intraocular Lens LENS IOL DIOP 22.0 [id number] JOHNSON   Left 1 Implanted      SURGEON:  Deirdre Evener, MD   ANESTHESIA:  Topical with tetracaine drops and 2% Xylocaine jelly, augmented with 1% preservative-free intracameral lidocaine.    COMPLICATIONS:  None.   DESCRIPTION OF PROCEDURE:  The patient was identified in the holding room and transported to the operating room and placed in the supine position under the operating microscope.  The left eye was identified as the operative eye and it was prepped and draped in the usual sterile ophthalmic fashion.   A 1 millimeter clear-corneal paracentesis was made at the 1:30 position.  0.5 ml of preservative-free 1% lidocaine was injected into the anterior chamber.  The anterior chamber was filled with Viscoat viscoelastic.  A 2.4 millimeter keratome was used to make a near-clear corneal incision at the 10:30 position.  .  A curvilinear capsulorrhexis was made with a cystotome and capsulorrhexis forceps.  Balanced salt solution was used to hydrodissect and hydrodelineate the nucleus.   Phacoemulsification was then used in stop and chop fashion to remove the lens nucleus and epinucleus.  The remaining cortex was then removed using the irrigation and aspiration handpiece. Provisc was then placed into the capsular bag to distend it for lens placement.  A lens was then injected into the capsular bag.  The  remaining viscoelastic was aspirated.   Wounds were hydrated with balanced salt solution.  The anterior chamber was inflated to a physiologic pressure with balanced salt solution.  No wound leaks were noted. Cefuroxime 0.1 ml of a 10mg /ml solution was injected into the anterior chamber for a dose of 1 mg of intracameral antibiotic at the completion of the case.   Timolol and Brimonidine drops were applied to the eye.  The patient was taken to the recovery room in stable condition without complications of anesthesia or surgery.  [name] 07/30/2020, 11:25 AM

## 2020-07-30 NOTE — Anesthesia Postprocedure Evaluation (Signed)
Anesthesia Post Note  Patient: Carla David  Procedure(s) Performed: CATARACT EXTRACTION PHACO AND INTRAOCULAR LENS PLACEMENT (IOC) LEFT 9.18 01:14.5 12.3% (Left Eye)     Patient location during evaluation: PACU Anesthesia Type: MAC Level of consciousness: awake and alert Pain management: pain level controlled Vital Signs Assessment: post-procedure vital signs reviewed and stable Respiratory status: spontaneous breathing, nonlabored ventilation, respiratory function stable and patient connected to nasal cannula oxygen Cardiovascular status: stable and blood pressure returned to baseline Postop Assessment: no apparent nausea or vomiting Anesthetic complications: no   No complications documented.  Sinda Du

## 2020-07-30 NOTE — H&P (Signed)

## 2020-07-30 NOTE — Anesthesia Preprocedure Evaluation (Signed)
Anesthesia Evaluation  Patient identified by MRN, date of birth, ID band Patient awake    Reviewed: Allergy & Precautions, NPO status , Patient's Chart, lab work & pertinent test results, reviewed documented beta blocker date and time   History of Anesthesia Complications Negative for: history of anesthetic complications  Airway Mallampati: II  TM Distance: >3 FB Neck ROM: Full    Dental   Pulmonary    breath sounds clear to auscultation       Cardiovascular hypertension, (-) angina(-) DOE  Rhythm:Regular Rate:Normal   Stress echo (02/2020): NORMAL STRESS TEST  VALVULAR REGURGITATION: TRIVIAL AR, TRIVIAL MR, TRIVIAL PR, TRIVIAL TR  NO VALVULAR STENOSIS  Maximum workload of 10.10 METs was achieved during exercise.     Neuro/Psych    GI/Hepatic neg GERD  ,  Endo/Other    Renal/GU      Musculoskeletal  (+) Arthritis ,   Abdominal   Peds  Hematology   Anesthesia Other Findings   Reproductive/Obstetrics                            Anesthesia Physical Anesthesia Plan  ASA: II  Anesthesia Plan: MAC   Post-op Pain Management:    Induction: Intravenous  PONV Risk Score and Plan: 2 and TIVA, Midazolam and Treatment may vary due to age or medical condition  Airway Management Planned: Nasal Cannula  Additional Equipment:   Intra-op Plan:   Post-operative Plan:   Informed Consent: I have reviewed the patients History and Physical, chart, labs and discussed the procedure including the risks, benefits and alternatives for the proposed anesthesia with the patient or authorized representative who has indicated his/her understanding and acceptance.       Plan Discussed with: CRNA and Anesthesiologist  Anesthesia Plan Comments:         Anesthesia Quick Evaluation  

## 2020-07-30 NOTE — Anesthesia Procedure Notes (Signed)
Procedure Name: MAC Date/Time: 07/30/2020 11:06 AM Performed by: Jeannene Patella, CRNA Pre-anesthesia Checklist: Patient identified, Emergency Drugs available, Suction available, Timeout performed and Patient being monitored Patient Re-evaluated:Patient Re-evaluated prior to induction Oxygen Delivery Method: Nasal cannula Placement Confirmation: positive ETCO2

## 2020-07-30 NOTE — Transfer of Care (Signed)
Immediate Anesthesia Transfer of Care Note  Patient: Carla David  Procedure(s) Performed: CATARACT EXTRACTION PHACO AND INTRAOCULAR LENS PLACEMENT (IOC) LEFT 9.18 01:14.5 12.3% (Left Eye)  Patient Location: PACU  Anesthesia Type: MAC  Level of Consciousness: awake, alert  and patient cooperative  Airway and Oxygen Therapy: Patient Spontanous Breathing and Patient connected to supplemental oxygen  Post-op Assessment: Post-op Vital signs reviewed, Patient's Cardiovascular Status Stable, Respiratory Function Stable, Patent Airway and No signs of Nausea or vomiting  Post-op Vital Signs: Reviewed and stable  Complications: No complications documented.

## 2020-07-31 ENCOUNTER — Encounter: Payer: Self-pay | Admitting: Ophthalmology

## 2021-07-15 IMAGING — CT CT ABD-PELV W/ CM
2 of 5 series · 16 of 46 positions shown, 18 images · IV contrast (omnipaque)
Comparison: 05/10/2012

CLINICAL DATA: Upper abdominal pain, bloating, constipation

EXAM:
CT ABDOMEN AND PELVIS WITH CONTRAST
TECHNIQUE: Multidetector CT imaging of the abdomen and pelvis was performed
using the standard protocol following bolus administration of
intravenous contrast.
CONTRAST:  100mL OMNIPAQUE IOHEXOL 300 MG/ML  SOLN

[Series 2: abd pelvis · axial · 0.69mm/px · z∈[-1516,-1086]mm · 13 of 100 slices shown, 15 images]
[im 7/100  soft-tissue]
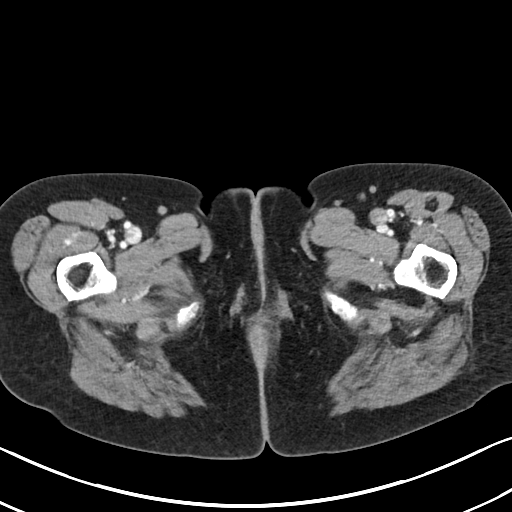
[im 7/100  bone]
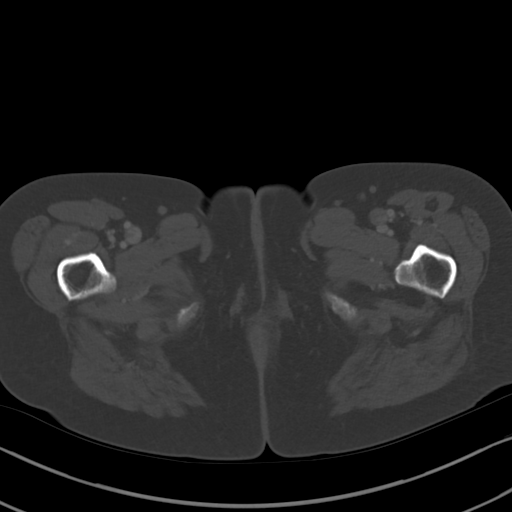
[im 13/100  soft-tissue]
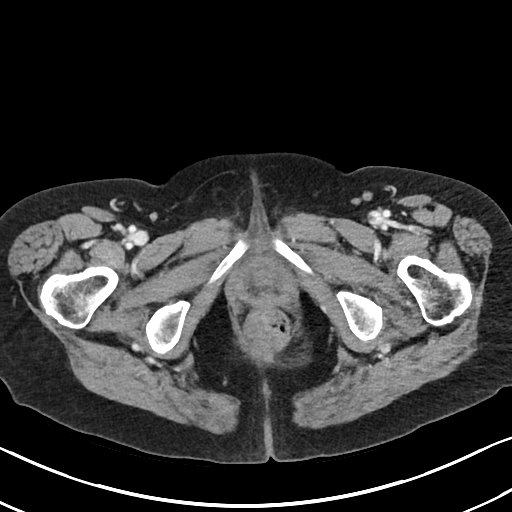
[im 19/100  soft-tissue]
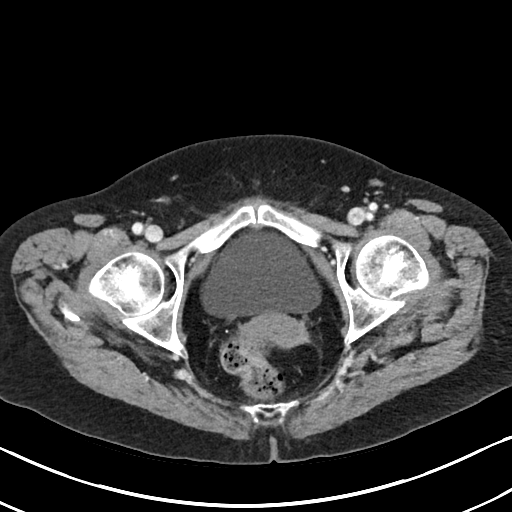
[im 31/100  soft-tissue]
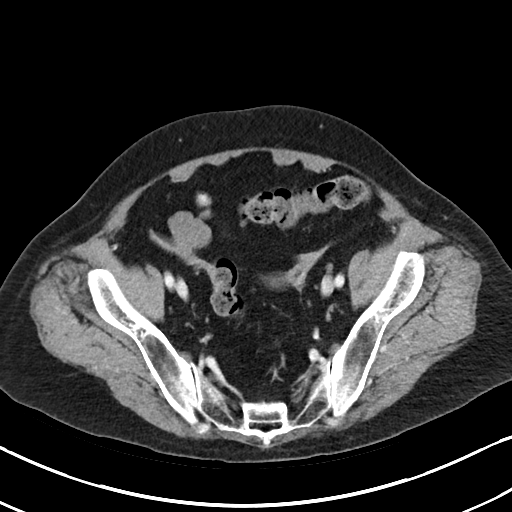
[im 38/100  soft-tissue]
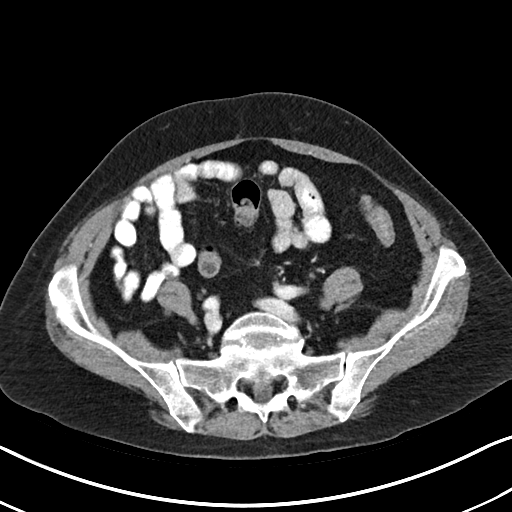
[im 44/100  soft-tissue]
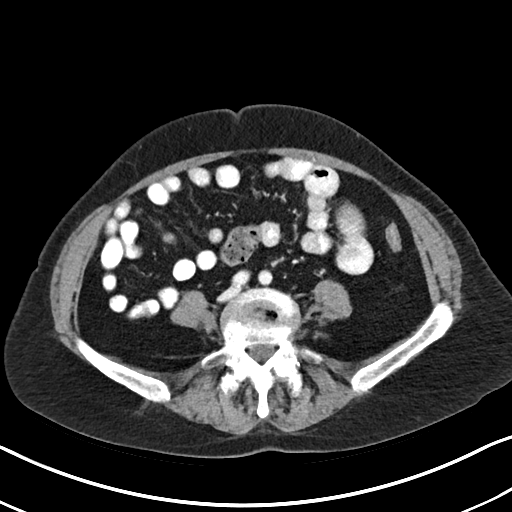
[im 50/100  soft-tissue]
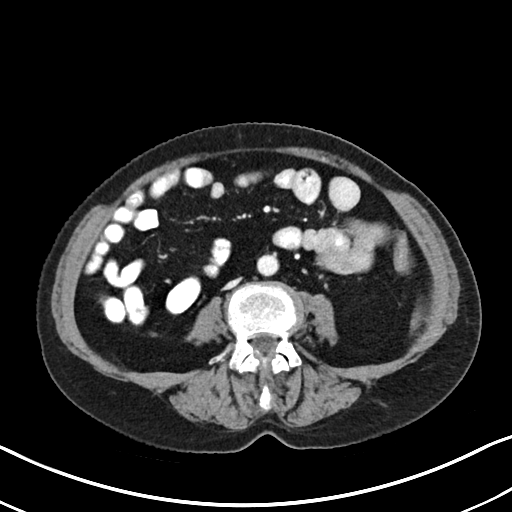
[im 56/100  soft-tissue]
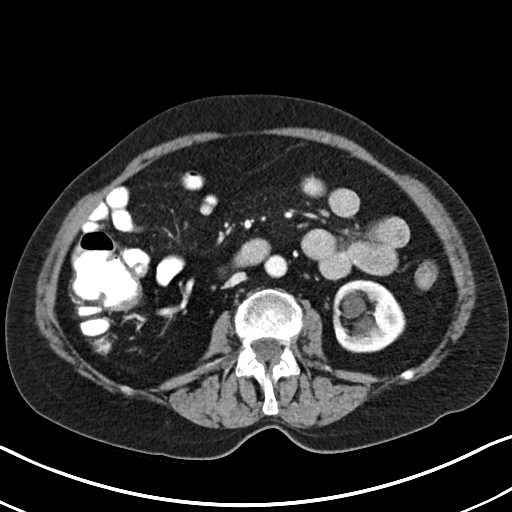
[im 62/100  soft-tissue]
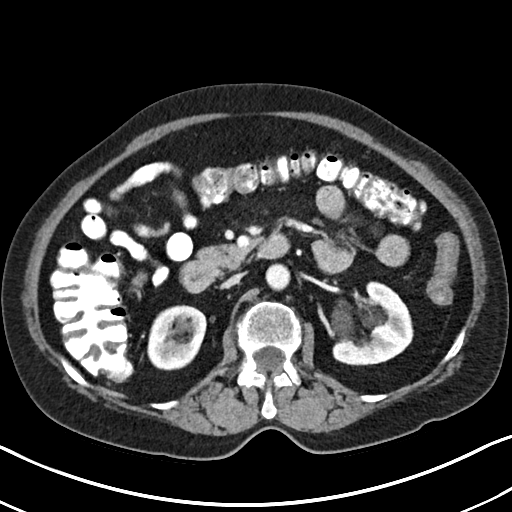
[im 62/100  bone]
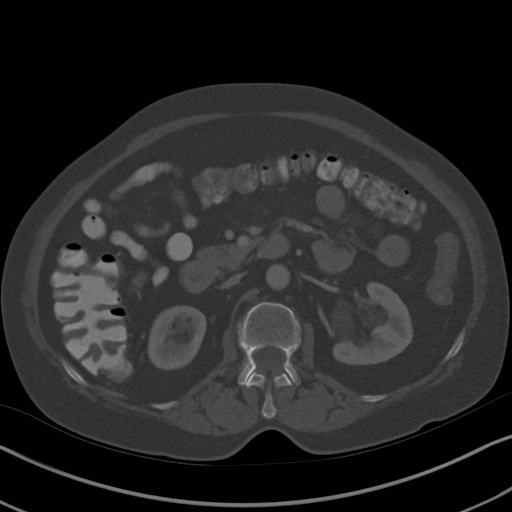
[im 69/100  soft-tissue]
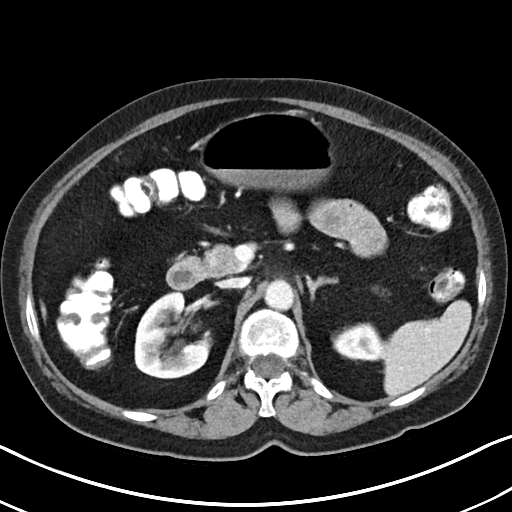
[im 81/100  soft-tissue]
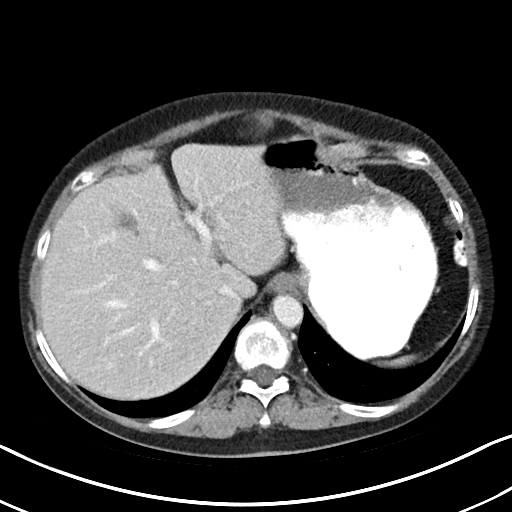
[im 87/100  soft-tissue]
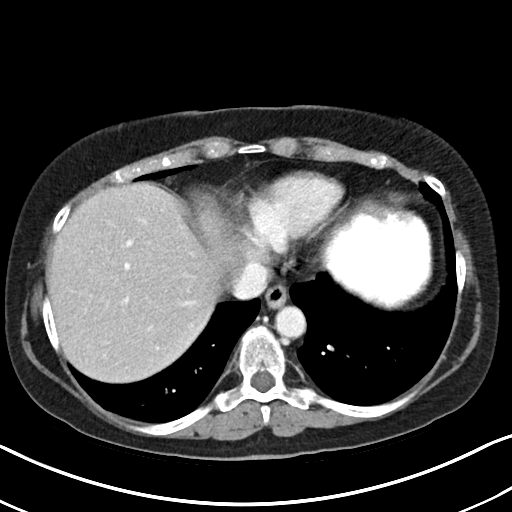
[im 93/100  soft-tissue]
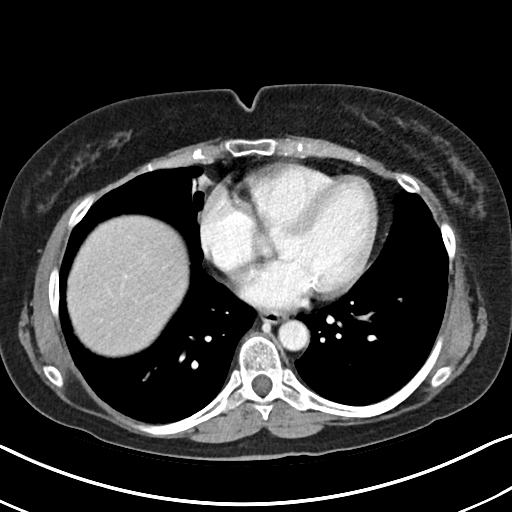

[Series 4: coronals abd pelvis · coronal · 0.69mm/px · 3 of 138 slices shown]
[im 46/138  soft-tissue]
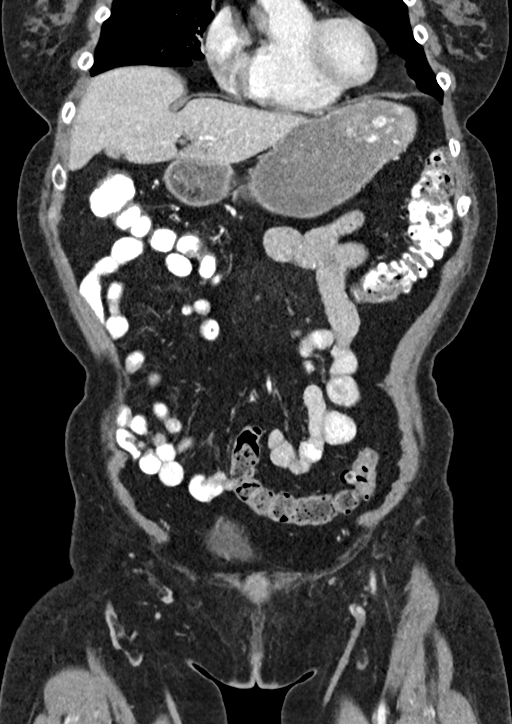
[im 61/138  soft-tissue]
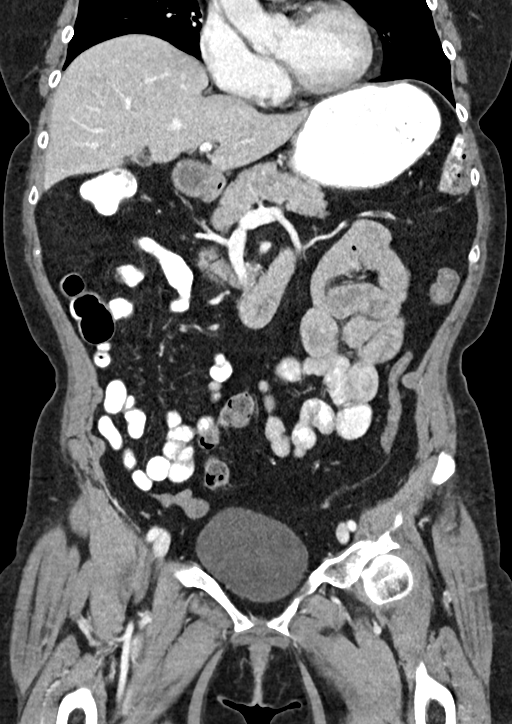
[im 77/138  soft-tissue]
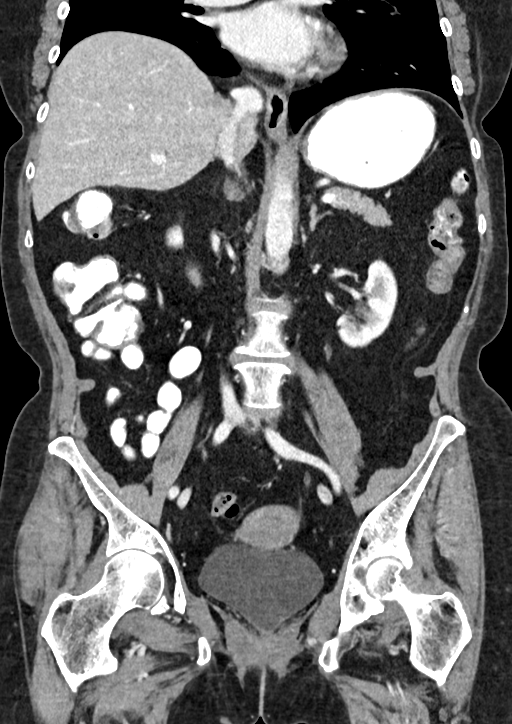

[16 of 46 positions shown; findings below may reference images not displayed]

FINDINGS: Lower chest: Scarring/atelectasis in the medial right middle lobe.
Lung bases otherwise clear.

Hepatobiliary: Liver is within normal limits.

Gallbladder is unremarkable. No intrahepatic or extrahepatic ductal
dilatation.

Pancreas: Within normal limits.

Spleen: Within normal limits.

Adrenals/Urinary Tract: Adrenal glands are within normal limits.

Bilateral renal sinus cysts. Kidneys are otherwise within normal
limits. No hydronephrosis.

Bladder is within normal limits.

Stomach/Bowel: Stomach is within normal limits.

No evidence of bowel obstruction.

Normal appendix (series 9/image 32).

Mild eccentric thickening along the posterior aspect of the cecum
adjacent to the base of the appendix (series 2/image 46), equivocal.

Vascular/Lymphatic: No evidence of abdominal aortic aneurysm.

No suspicious abdominopelvic lymphadenopathy.

Reproductive: Uterus is within normal limits.

Left ovary is within normal limits.  No right adnexal mass.

Other: No abdominopelvic ascites.

Musculoskeletal: Mild degenerative changes of the visualized
thoracolumbar spine.
IMPRESSION: No CT findings to account for the patient's upper abdominal pain.

No evidence of bowel obstruction.  Normal appendix.

Possible mild eccentric wall thickening along the posterior cecum
adjacent to the base of the appendix, equivocal. Consider
colonoscopy for further evaluation, as clinically warranted.

## 2022-11-19 ENCOUNTER — Other Ambulatory Visit
Admission: RE | Admit: 2022-11-19 | Discharge: 2022-11-19 | Disposition: A | Discharge status: Home / Self Care | Payer: Medicare Other | Source: Ambulatory Visit | Attending: Internal Medicine | Admitting: Internal Medicine

## 2022-11-19 DIAGNOSIS — S299XXA Unspecified injury of thorax, initial encounter: Secondary | ICD-10-CM | POA: Diagnosis present

## 2022-11-19 DIAGNOSIS — M546 Pain in thoracic spine: Secondary | ICD-10-CM | POA: Insufficient documentation

## 2022-11-19 LAB — D-DIMER, QUANTITATIVE: D-Dimer, Quant: 0.37 ug/mL-FEU (ref 0.00–0.50)
